# Patient Record
Sex: Female | Born: 1980 | Hispanic: Yes | Marital: Single
Health system: Midwestern US, Academic
[De-identification: ages and names within clinical notes are randomized; demographics above are authoritative.]

---

## 2019-03-04 ENCOUNTER — Encounter: Admit: 2019-03-04 | Discharge: 2019-03-04

## 2019-03-04 DIAGNOSIS — S52109A Unspecified fracture of upper end of unspecified radius, initial encounter for closed fracture: Secondary | ICD-10-CM

## 2019-03-05 ENCOUNTER — Ambulatory Visit: Admit: 2019-03-04 | Discharge: 2019-03-05

## 2019-03-05 DIAGNOSIS — S52102A Unspecified fracture of upper end of left radius, initial encounter for closed fracture: Secondary | ICD-10-CM

## 2019-03-07 ENCOUNTER — Encounter: Admit: 2019-03-07 | Discharge: 2019-03-07

## 2019-03-07 DIAGNOSIS — M25521 Pain in right elbow: Secondary | ICD-10-CM
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
## 2019-03-11 ENCOUNTER — Ambulatory Visit: Admit: 2019-03-11 | Discharge: 2019-03-11

## 2019-03-11 ENCOUNTER — Encounter: Admit: 2019-03-11 | Discharge: 2019-03-11

## 2019-03-11 DIAGNOSIS — M25521 Pain in right elbow: Secondary | ICD-10-CM

## 2019-03-11 DIAGNOSIS — M25522 Pain in left elbow: Secondary | ICD-10-CM

## 2019-03-11 DIAGNOSIS — S52109A Unspecified fracture of upper end of unspecified radius, initial encounter for closed fracture: Secondary | ICD-10-CM
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
## 2020-02-06 ENCOUNTER — Encounter: Admit: 2020-02-06 | Discharge: 2020-02-06

## 2020-02-06 ENCOUNTER — Emergency Department: Admit: 2020-02-06 | Discharge: 2020-02-06

## 2020-10-01 ENCOUNTER — Encounter: Admit: 2020-10-01 | Discharge: 2020-10-01

## 2023-07-03 IMAGING — MG MAMMO DIAGNOSTIC BILAT W/TOMO
1 series · 1 of 1 positions shown · non-contrast
Comparison: Mammograms dating back to 12/23/2018

PatientID:[id number]

EXAM: DIGITAL BILATERAL DIAGNOSTIC MAMMOGRAM, SYNTHESIZED 2D MAMMOGRAM,
TOMOSYNTHESIS
ACCESSION: [id number]
INDICATION: 2-year follow-up of probably benign right breast
microcalcifications.
TECHNIQUE: CC, MLO, right ML, and right spot compression magnification
 CC and ML
2D and 3D tomosynthesis and selected synthesized views were obtained. Computer
aided detection was utilized and assisted with interpretation. 
TISSUE DENSITY:  The breasts are heterogeneously dense, which may obscure
 small
masses.

[Hologic R2 ImageChecker CAD SC]
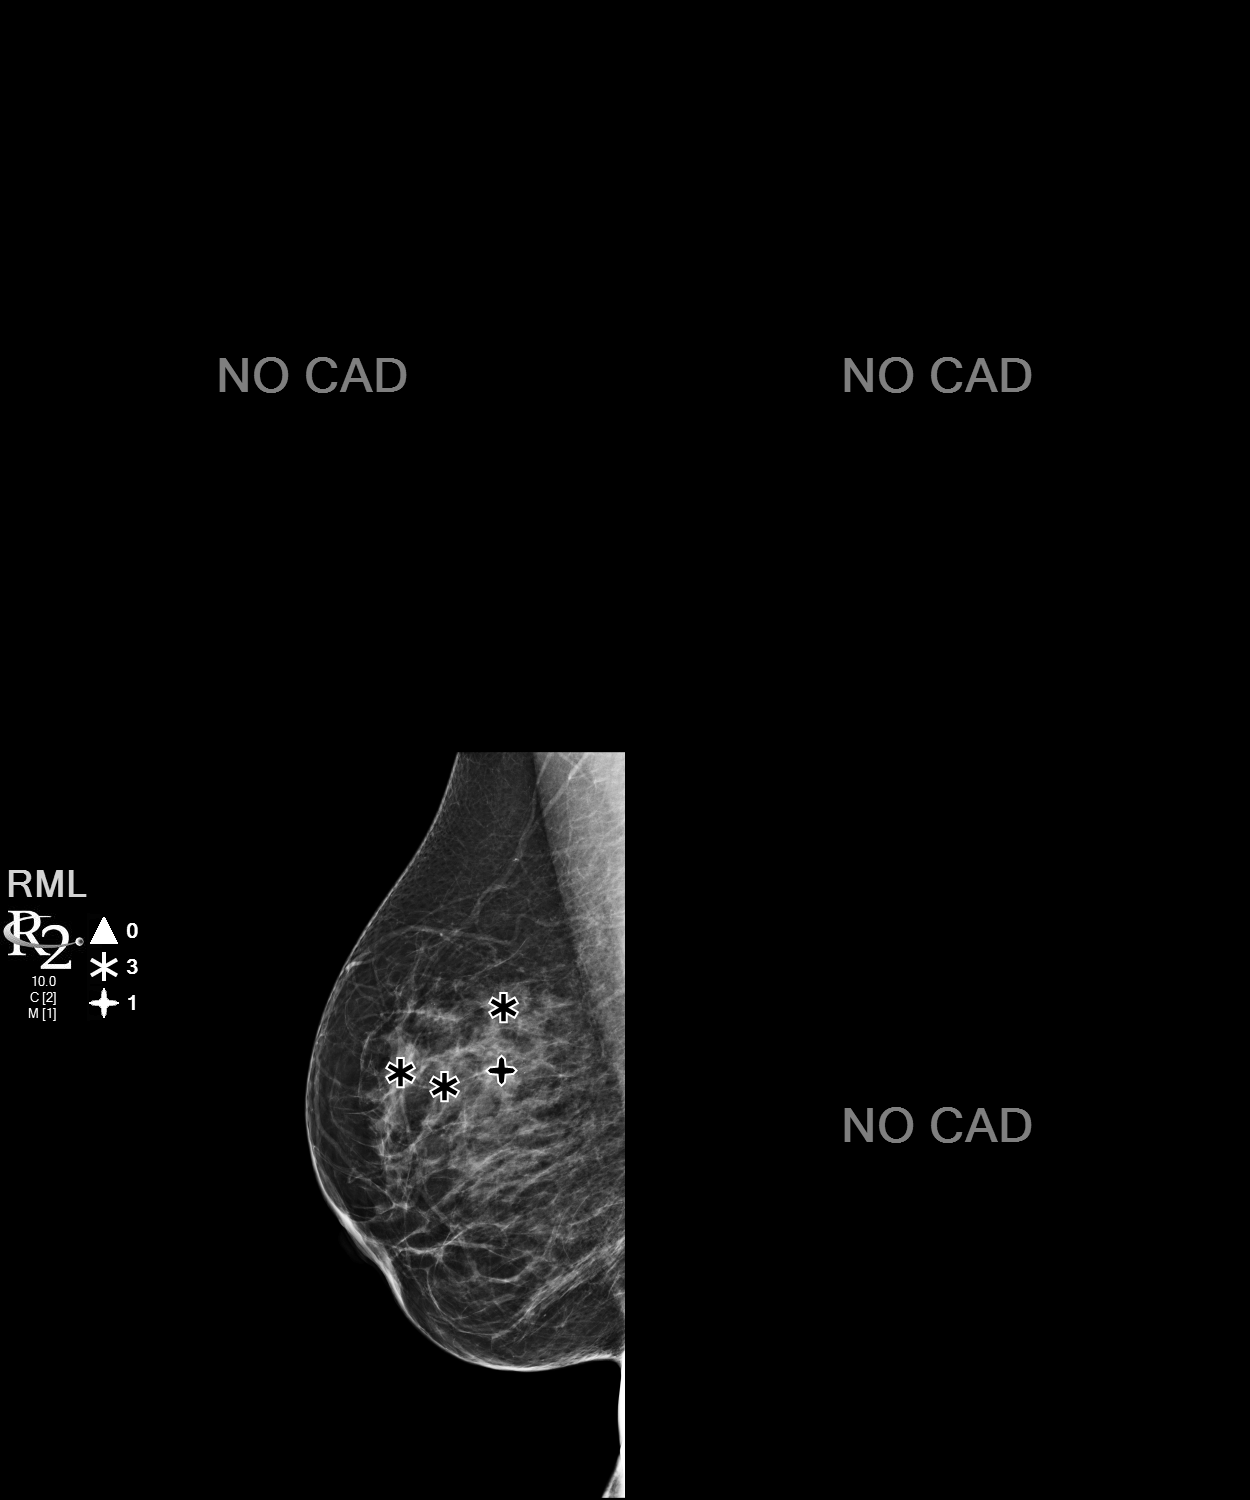

[1 of 1 positions shown; findings below may reference images not displayed]

FINDINGS: Within the upper outer right breast, mid depth, there is a stable

group of now predominately layering microcalcifications, consistent with

 benign

milk of calcium. This is now deemed benign. Otherwise, there is no suspicious

mass, unexplained architectural distortion, or suspicious grouped

microcalcifications within either breast.
IMPRESSION: 1.  Benign milk of calcium within the right breast. No further imaging

surveillance is warranted. Annual screening mammography is recommended.

ASSESSMENT: ACR BI-RADS Category 2 - Benign.  

RECOMMENDATION:

1.  Routine screening mammogram Bilateral in 1 Year

COMMENTS: 

"If your mammogram demonstrates that you have dense breast tissue, which

 could

hide abnormalities, and you have other risk factors for breast cancer that

 have

been identified, you might benefit from supplemental screening tests that

 may be

suggested by your ordering physician. Dense breast tissue, in and of itself,

 is

a relatively common condition. Therefore, this information is not provided

 to

cause undue concern, but rather to raise your awareness and to promote

discussion with your physician regarding the presence of other risk factors,

 in

addition to dense breast tissue. A report of your mammography results will

 be

sent to you and your physician. You should contact your physician if you

 have

any questions or concerns regarding this report."
